# Patient Record
Sex: Male | Born: 1995 | Race: White | Hispanic: No | Marital: Married | Smoking: Never smoker
Health system: Southern US, Community
[De-identification: ages and names within clinical notes are randomized; demographics above are authoritative.]

---
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
## 2016-08-15 ENCOUNTER — Emergency Department
Admission: EM | Admit: 2016-08-15 | Discharge: 2016-08-15 | Disposition: A | Discharge status: Home / Self Care | Payer: Self-pay | Attending: Emergency Medicine | Admitting: Emergency Medicine

## 2016-08-15 ENCOUNTER — Encounter: Payer: Self-pay | Admitting: Physician Assistant

## 2016-08-15 DIAGNOSIS — Z79899 Other long term (current) drug therapy: Secondary | ICD-10-CM | POA: Insufficient documentation

## 2016-08-15 DIAGNOSIS — J029 Acute pharyngitis, unspecified: Secondary | ICD-10-CM | POA: Insufficient documentation

## 2016-08-15 LAB — POCT RAPID STREP A: Streptococcus, Group A Screen (Direct): NEGATIVE

## 2016-08-15 MED ORDER — LIDOCAINE VISCOUS 2 % MT SOLN
15.0000 mL | Freq: Once | OROMUCOSAL | Status: AC
  Administered 2016-08-15: 15 mL via OROMUCOSAL
  Filled 2016-08-15: qty 15

## 2016-08-15 MED ORDER — MAGIC MOUTHWASH W/LIDOCAINE
5.0000 mL | Freq: Four times a day (QID) | ORAL | 0 refills | Status: DC | PRN
Start: 2016-08-15 — End: 2019-03-19

## 2016-08-15 MED ORDER — ACETAMINOPHEN-CODEINE #3 300-30 MG PO TABS: 1.0000 | ORAL_TABLET | Freq: Four times a day (QID) | ORAL | 0 refills | Status: DC | PRN

## 2016-08-15 MED ORDER — PREDNISONE 10 MG PO TABS: 10.0000 mg | ORAL_TABLET | Freq: Two times a day (BID) | ORAL | 0 refills | Status: DC

## 2016-08-15 MED ORDER — DEXAMETHASONE SODIUM PHOSPHATE 10 MG/ML IJ SOLN
10.0000 mg | Freq: Once | INTRAMUSCULAR | Status: AC
  Administered 2016-08-15: 10 mg via INTRAMUSCULAR
  Filled 2016-08-15: qty 1

## 2016-08-15 NOTE — ED Triage Notes (Addendum)
Pt to ED reporting sore throat x 4 days. PT reports having cold sweats at home but unsure if he had a fever. Pt is afebrile today. Pain increases when swallowing. No SOB reported. Tonsils are swollen but airway intact.

## 2016-08-15 NOTE — Discharge Instructions (Signed)
Your rapid strep test was negative. Your symptoms appear to be due to a viral infection. You should take the prescription meds as directed. You will be notified if your throat culture is positive and requires an antibiotic. Follow-up with [facility] as needed.

## 2016-08-16 LAB — CULTURE, GROUP A STREP (THRC)

## 2016-08-16 NOTE — ED Provider Notes (Signed)
[facility] Emergency Department Provider Note ____________________________________________  Time seen: 1324  I have reviewed the triage vital signs and the nursing notes.  HISTORY  Chief Complaint  Sore Throat  HPI Mark Wright is a 21 y.o. male resistance to the ED for evaluation of a 4 day complaint of sore throat. Patient reports having cold sweats at home but is unclear of any frank, however fevers. He is in no acute distress upon arrival and is afebrile at this time. He reports pain increases when he tries to swallow. He denies any shortness of breath, nausea, vomiting, rash, or sick contacts. Patient reports that his tonsils feel swollen and enlarged. He denies history of recurrent strep infection.  History reviewed. No pertinent past medical history.  There are no active problems to display for this patient.  History reviewed. No pertinent surgical history.  Prior to Admission medications   Medication Sig Start Date End Date Taking? Authorizing Provider  acetaminophen-codeine (TYLENOL #3) 300-30 MG tablet Take 1 tablet by mouth every 6 (six) hours as needed for moderate pain. 08/15/16   Jenkins Risdon V Bacon Kendale Rembold, PA-C  dexamethasone (DECADRON) 2 MG tablet Take 6 tablets on Day 1 with food, then decrease by 1 tablet daily until finished (6,5,4,3,2,1) 11/23/15   Jami L Hagler, PA-C  hydrOXYzine (VISTARIL) 25 MG capsule Take 1 capsule (25 mg total) by mouth 3 (three) times daily as needed. 11/23/15   Jami L Hagler, PA-C  magic mouthwash w/lidocaine SOLN Take 5 mLs by mouth 4 (four) times daily as needed for mouth pain. 08/15/16   Elsa Ploch V Bacon Manuela Halbur, PA-C  predniSONE (DELTASONE) 10 MG tablet Take 1 tablet (10 mg total) by mouth 2 (two) times daily with a meal. 08/15/16   Meeghan Skipper V Bacon Brittanyann Wittner, PA-C   Allergies Patient has no known allergies.  No family history on file.  Social History Social History  Substance Use Topics  . Smoking status: Never Smoker   . Smokeless tobacco: Never Used  . Alcohol use Not on file   Review of Systems  Constitutional: Negative for fever. Eyes: Negative for visual changes. ENT: Positive for sore throat. Cardiovascular: Negative for chest pain. Respiratory: Negative for shortness of breath. Gastrointestinal: Negative for abdominal pain, vomiting and diarrhea. Genitourinary: Negative for dysuria. Skin: Negative for rash. Neurological: Negative for headaches, focal weakness or numbness. ____________________________________________  PHYSICAL EXAM:  VITAL SIGNS: ED Triage Vitals [08/15/16 1227]  Enc Vitals Group     BP 131/84     Pulse Rate 80     Resp 16     Temp 97.7 F (36.5 C)     Temp Source Oral     SpO2 99 %     Weight 168 lb (76.2 kg)     Height  (1.727 m)     Head Circumference      Peak Flow      Pain Score 5     Pain Loc      Pain Edu?      Excl. in GC?     Constitutional: Alert and oriented. Well appearing and in no distress. Head: Normocephalic and atraumatic. Eyes: Conjunctivae are normal. PERRL. Normal extraocular movements Ears: Canals clear. TMs intact bilaterally. Nose: No congestion/rhinorrhea/epistaxis. Mouth/Throat: Mucous membranes are moist. Uvula is midline. Tonsils are erythematous, enlarged, with mild, patchy exudates noted, bilaterally. Neck: Supple. No thyromegaly. Hematological/Lymphatic/Immunological: No cervical lymphadenopathy. Cardiovascular: Normal rate, regular rhythm. Normal distal pulses. Respiratory: Normal respiratory effort. No wheezes/rales/rhonchi. Gastrointestinal: Soft  and nontender. No distention. Neurologic:  Normal gait without ataxia. Normal speech and language. No gross focal neurologic deficits are appreciated. Skin:  Skin is warm, dry and intact. No rash noted. ____________________________________________   LABS (pertinent positives/negatives) Labs Reviewed  CULTURE, GROUP A STREP [facility])  POCT RAPID STREP A   ____________________________________________  PROCEDURES  Decadron 10 mg IM Viscous Lido 2% gargle ____________________________________________  INITIAL IMPRESSION / ASSESSMENT AND PLAN / ED COURSE  Patient with a clinical presentation which appears consistent with a viral pharyngitis. He has been afebrile since his course began and is afebrile on presentation. His tonsils are erythematous, and enlarged, and do show some mild patchy exudates,  However, his rapid strep test was negative and a throat cultures are pending at the time of discharge. He'll be discharged with prescriptions for Tylenol #3 , Magic mouthwash, as well as prednisone to take for the next 5 days. He will follow up to his culture results or return to ED as needed. ____________________________________________  FINAL CLINICAL IMPRESSION(S) / ED DIAGNOSES  Final diagnoses:  Viral pharyngitis      Lissa Hoard, PA-C 08/16/16 1852    Myrna Blazer, MD 08/17/16 [phone]

## 2017-02-12 ENCOUNTER — Emergency Department
Admission: EM | Admit: 2017-02-12 | Discharge: 2017-02-12 | Disposition: A | Discharge status: Home / Self Care | Payer: Self-pay | Attending: Emergency Medicine | Admitting: Emergency Medicine

## 2017-02-12 ENCOUNTER — Encounter: Payer: Self-pay | Admitting: Emergency Medicine

## 2017-02-12 DIAGNOSIS — X58XXXA Exposure to other specified factors, initial encounter: Secondary | ICD-10-CM | POA: Insufficient documentation

## 2017-02-12 DIAGNOSIS — Y990 Civilian activity done for income or pay: Secondary | ICD-10-CM | POA: Insufficient documentation

## 2017-02-12 DIAGNOSIS — Y929 Unspecified place or not applicable: Secondary | ICD-10-CM | POA: Insufficient documentation

## 2017-02-12 DIAGNOSIS — S0500XA Injury of conjunctiva and corneal abrasion without foreign body, unspecified eye, initial encounter: Secondary | ICD-10-CM

## 2017-02-12 DIAGNOSIS — S0502XA Injury of conjunctiva and corneal abrasion without foreign body, left eye, initial encounter: Secondary | ICD-10-CM | POA: Insufficient documentation

## 2017-02-12 DIAGNOSIS — Z79899 Other long term (current) drug therapy: Secondary | ICD-10-CM | POA: Insufficient documentation

## 2017-02-12 DIAGNOSIS — Y939 Activity, unspecified: Secondary | ICD-10-CM | POA: Insufficient documentation

## 2017-02-12 MED ORDER — ERYTHROMYCIN 5 MG/GM OP OINT
TOPICAL_OINTMENT | Freq: Three times a day (TID) | OPHTHALMIC | 0 refills | Status: AC
Start: 2017-02-12 — End: 2017-02-22

## 2017-02-12 MED ORDER — FLUORESCEIN SODIUM 1 MG OP STRP
1.0000 | ORAL_STRIP | Freq: Once | OPHTHALMIC | Status: AC
  Administered 2017-02-12: 1 via OPHTHALMIC
  Filled 2017-02-12: qty 1

## 2017-02-12 MED ORDER — KETOROLAC TROMETHAMINE 0.5 % OP SOLN: 1.0000 [drp] | Freq: Four times a day (QID) | OPHTHALMIC | 0 refills | Status: DC

## 2017-02-12 MED ORDER — TETRACAINE HCL 0.5 % OP SOLN
2.0000 [drp] | Freq: Once | OPHTHALMIC | Status: AC
  Administered 2017-02-12: 2 [drp] via OPHTHALMIC
  Filled 2017-02-12: qty 4

## 2017-02-12 MED ORDER — EYE WASH OPHTH SOLN
1.0000 [drp] | OPHTHALMIC | Status: DC | PRN
Start: 2017-02-12 — End: 2017-02-12
  Administered 2017-02-12: 1 [drp] via OPHTHALMIC
  Filled 2017-02-12: qty 118

## 2017-02-12 NOTE — ED Provider Notes (Signed)
[facility] Emergency Department Provider Note   ____________________________________________   I have reviewed the triage vital signs and the nursing notes.   HISTORY  Chief Complaint Eye Problem    HPI Mark Wright is a 21 y.o. male presents to the emergency department with left eye pain, redness, drainage, light sensitivity for 3 days. Patient works in [employer] and he noted after his workday onset of the above symptoms. 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 He denies feeling he has a foreign body in his eye at this time although he feels current symptoms are as result of the foreign body at some point. Patient denies loss of visual acuity, double vision or blurry vision. Patient denies any past history of foreign body in the eye or traumatic injury to the eye. Patient denies fever, chills, headache, chest pain, chest tightness, shortness of breath, abdominal pain, nausea and vomiting.  History reviewed. No pertinent past medical history.  There are no active problems to display for this patient.   History reviewed. No pertinent surgical history.  Prior to Admission medications   Medication Sig Start Date End Date Taking? Authorizing Provider  acetaminophen-codeine (TYLENOL #3) 300-30 MG tablet Take 1 tablet by mouth every 6 (six) hours as needed for moderate pain. 08/15/16   Menshew, Charlesetta Ivory, PA-C  dexamethasone (DECADRON) 2 MG tablet Take 6 tablets on Day 1 with food, then decrease by 1 tablet daily until finished (6,5,4,3,2,1) 11/23/15   Hagler, Jami L, PA-C  erythromycin [facility]) ophthalmic ointment Place into the right eye 3 (three) times daily. Place a 1/2 inch ribbon of ointment into the lower eyelid. 02/12/17 02/22/17  Rylyn Ranganathan M, PA-C  hydrOXYzine (VISTARIL) 25 MG capsule Take 1 capsule (25 mg total) by mouth 3 (three) times daily as needed. 11/23/15   Hagler, Jami L, PA-C  ketorolac (ACULAR)  0.5 % ophthalmic solution Place 1 drop into the left eye 4 (four) times daily. 02/12/17   Charee Tumblin M, PA-C  magic mouthwash w/lidocaine SOLN Take 5 mLs by mouth 4 (four) times daily as needed for mouth pain. 08/15/16   Menshew, Charlesetta Ivory, PA-C  predniSONE (DELTASONE) 10 MG tablet Take 1 tablet (10 mg total) by mouth 2 (two) times daily with a meal. 08/15/16   Menshew, Charlesetta Ivory, PA-C    Allergies Patient has no known allergies.  No family history on file.  Social History Social History  Substance Use Topics  . Smoking status: Never Smoker  . Smokeless tobacco: Never Used  . Alcohol use Yes    Review of Systems Constitutional: Negative for fever/chills Eyes: Left eye pain, redness and light sensitivity. Cardiovascular: Denies chest pain. Respiratory: Denies shortness of breath. Skin: Negative for rash. Neurological: Negative for headaches.  Negative focal weakness or numbness. Negative for loss of consciousness.  ____________________________________________   PHYSICAL EXAM:  VITAL SIGNS: ED Triage Vitals  Enc Vitals Group     BP 02/12/17 1342 135/85     Pulse Rate 02/12/17 1342 (!) 56     Resp 02/12/17 1342 14     Temp 02/12/17 1342 (!) 97.5 F (36.4 C)     Temp Source 02/12/17 1342 Oral     SpO2 02/12/17 1342 99 %     Weight 02/12/17 1343 164 lb (74.4 kg)     Height 02/12/17 1343  (1.778 m)     Head Circumference --  Peak Flow --      Pain Score --      Pain Loc --      Pain Edu? --      Excl. in GC? --     Constitutional: Alert and oriented. Well appearing and in no acute distress.  Eyes: Subconjunctival hemorrhage of the left eye, right eye normal. PERRL. EOMI, intact visual acuity bilaterally. Head: Normocephalic and atraumatic. Respiratory: Normal respiratory effort without tachypnea or retractions.  Cardiovascular: Normal rate, regular rhythm. Normal distal pulses. Neurologic: Normal speech and language.  Skin:  Skin is warm, dry and  intact. No rash noted. Psychiatric: Mood and affect are normal. Speech and behavior are normal. Patient exhibits appropriate insight and judgement.  ____________________________________________   LABS (all labs ordered are listed, but only abnormal results are displayed)  Labs Reviewed - No data to display ____________________________________________  EKG None ____________________________________________  RADIOLOGY None ____________________________________________   PROCEDURES  Procedure(s) performed: Fluorescein stain eye exam performed following physical exam:  Performed by: Clois Comber Authorized by: Clois Comber Consent: Verbal consent obtained. Risks and benefits: risks, benefits and alternatives were discussed Consent given by: patient Irrigated left eye with Eye Stream eye wash.  (1) drop Tetracaine instilled followed by instillation of fluorescein dye with fluorescein strip.  Examination with Joseph Art slit lamp performed: Small corneal abrasion noted at 1:00 position of left eye. No other defects noted.  Following the exam:  Left eye irrigated with Eye Stream and (1) drop of Tetracaine instilled.   Patient tolerance: Patient tolerated the procedure well with no immediate complications    Critical Care performed: no ____________________________________________   INITIAL IMPRESSION / ASSESSMENT AND PLAN / ED COURSE  Pertinent labs & imaging results that were available during my care of the patient were reviewed by me and considered in my medical decision making (see chart for details).   Patient presents to emergency department with left eye pain, some conjunctival hemorrhage, clear watery drainage, light sensitivity for 3 days.  History, physical exam findings, fluorescein stain eye exam findings are consistent with small corneal abrasion. Patient will be prescribed erythromycin ophthalmic ointment and Acular eyedrops. Patient advised to follow up with  ophthalmology as needed or return to the emergency department if symptoms return or worsen. Patient informed of clinical course, understand medical decision-making process, and agree with plan. _______________________________   FINAL CLINICAL IMPRESSION(S) / ED DIAGNOSES  Final diagnoses:  Corneal abrasion, unspecified laterality, initial encounter       NEW MEDICATIONS STARTED DURING THIS VISIT:  Discharge Medication List as of 02/12/2017  3:20 PM    START taking these medications   Details  erythromycin (ROMYCIN) ophthalmic ointment Place into the right eye 3 (three) times daily. Place a 1/2 inch ribbon of ointment into the lower eyelid., Starting Thu 02/12/2017, Until Sun 02/22/2017, Print    ketorolac (ACULAR) 0.5 % ophthalmic solution Place 1 drop into the left eye 4 (four) times daily., Starting Thu 02/12/2017, Print         Note:  This document was prepared using Dragon voice recognition software and may include unintentional dictation errors.    Clois Comber, PA-C 02/13/17 1656    Jeanmarie Plant, MD 02/14/17 [phone]

## 2017-02-12 NOTE — ED Notes (Signed)
Left eye red, swollen, sensitive to light x 3 days.

## 2017-02-12 NOTE — ED Triage Notes (Signed)
Says he has exudate from left eye for 3 days.  Says some pain, no itching.  No injury.

## 2017-02-12 NOTE — ED Triage Notes (Signed)
Vis acuity  Left 20/25 Right 20/20

## 2017-02-12 NOTE — Discharge Instructions (Signed)
Take medication as prescribed. Return to emergency department if symptoms worsen and follow-up with PCP as needed.   °

## 2019-02-28 ENCOUNTER — Other Ambulatory Visit: Payer: Self-pay

## 2019-02-28 DIAGNOSIS — Z20822 Contact with and (suspected) exposure to covid-19: Secondary | ICD-10-CM

## 2019-03-01 LAB — NOVEL CORONAVIRUS, NAA: SARS-CoV-2, NAA: NOT DETECTED

## 2019-03-02 ENCOUNTER — Telehealth: Payer: Self-pay | Admitting: *Deleted

## 2019-03-02 NOTE — Telephone Encounter (Signed)
Calling for covid19 results. Informed none available as of yet.

## 2019-03-19 ENCOUNTER — Other Ambulatory Visit: Payer: Self-pay

## 2019-03-19 ENCOUNTER — Encounter: Payer: Self-pay | Admitting: Emergency Medicine

## 2019-03-19 ENCOUNTER — Emergency Department
Admission: EM | Admit: 2019-03-19 | Discharge: 2019-03-19 | Disposition: A | Discharge status: Home / Self Care | Payer: Self-pay | Attending: Emergency Medicine | Admitting: Emergency Medicine

## 2019-03-19 DIAGNOSIS — L237 Allergic contact dermatitis due to plants, except food: Secondary | ICD-10-CM | POA: Insufficient documentation

## 2019-03-19 MED ORDER — PREDNISONE 10 MG PO TABS: ORAL_TABLET | ORAL | 0 refills | Status: AC

## 2019-03-19 MED ORDER — FAMOTIDINE 20 MG PO TABS
20.0000 mg | ORAL_TABLET | Freq: Once | ORAL | Status: AC
  Administered 2019-03-19: 10:00:00 20 mg via ORAL
  Filled 2019-03-19: qty 1

## 2019-03-19 MED ORDER — METHYLPREDNISOLONE SODIUM SUCC 125 MG IJ SOLR
125.0000 mg | Freq: Once | INTRAMUSCULAR | Status: AC
  Administered 2019-03-19: 125 mg via INTRAMUSCULAR
  Filled 2019-03-19: qty 2
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
## 2019-03-19 NOTE — ED Notes (Signed)
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

## 2019-03-19 NOTE — ED Triage Notes (Signed)
Rash arms x 5 days.

## 2020-06-28 ENCOUNTER — Other Ambulatory Visit: Payer: Self-pay

## 2020-06-28 ENCOUNTER — Encounter: Payer: Self-pay | Admitting: Emergency Medicine

## 2020-06-28 DIAGNOSIS — K029 Dental caries, unspecified: Secondary | ICD-10-CM | POA: Insufficient documentation

## 2020-06-28 NOTE — ED Triage Notes (Signed)
Patient ambulatory to triage with steady gait, without difficulty or distress noted; st last wk noted rt earache; began taking some amoxi and felt better then noted it was dental pain; tonight having worse "shooting" pain to jaw and ear

## 2020-06-29 ENCOUNTER — Emergency Department
Admission: EM | Admit: 2020-06-29 | Discharge: 2020-06-29 | Disposition: A | Discharge status: Home / Self Care | Payer: Self-pay | Attending: Emergency Medicine | Admitting: Emergency Medicine

## 2020-06-29 DIAGNOSIS — K0889 Other specified disorders of teeth and supporting structures: Secondary | ICD-10-CM

## 2020-06-29 DIAGNOSIS — K029 Dental caries, unspecified: Secondary | ICD-10-CM

## 2020-06-29 NOTE — ED Provider Notes (Signed)
[facility] Emergency Department Provider Note   ____________________________________________   Event Date/Time   First MD Initiated Contact with Patient 06/29/20 0113     (approximate)  I have reviewed the triage vital signs and the nursing notes.   HISTORY  Chief Complaint Jaw Pain    HPI Mark Wright is a 25 y.o. male with a stated past medical history of dental caries who presents for right-sided facial and jaw pain that radiates to his ear and has been worsening over the last week.  Patient describes an aching, 10/10, radiating right tooth and facial pain that is worse with chewing.  Patient states that he grinds his teeth at night and is often woken up with severe pain to the right side of his face.  Patient denies any purulent drainage from this tooth, fevers, nausea/vomiting, blurry vision, or tinnitus         History reviewed. No pertinent past medical history.  There are no problems to display for this patient.   History reviewed. No pertinent surgical history.  Prior to Admission medications   Medication Sig Start Date End Date Taking? Authorizing Provider  predniSONE (DELTASONE) 10 MG tablet Take 6 pills days 1-3. Take 5 pills day 4-6. Take 4 pills day 7-9. Take 3 pills day 10-12. Take 2 pills day 13-15. Take 1 pill day 16-18. 03/19/19   Enid Derry, PA-C    Allergies Patient has no known allergies.  No family history on file.  Social History Social History   Tobacco Use  . Smoking status: Never Smoker  . Smokeless tobacco: Never Used  Vaping Use  . Vaping Use: Never used  Substance Use Topics  . Alcohol use: Yes    Review of Systems Constitutional: No fever/chills Eyes: No visual changes. ENT: No sore throat.  Endorses dental pain on the right side Cardiovascular: Denies chest pain. Respiratory: Denies shortness of breath. Gastrointestinal: No abdominal pain.  No nausea, no vomiting.  No diarrhea. Genitourinary:  Negative for dysuria. Musculoskeletal: Negative for acute arthralgias Skin: Negative for rash. Neurological: Negative for headaches, weakness/numbness/paresthesias in any extremity Psychiatric: Negative for suicidal ideation/homicidal ideation   ____________________________________________   PHYSICAL EXAM:  VITAL SIGNS: ED Triage Vitals  Enc Vitals Group     BP 06/28/20 2321 140/79     Pulse Rate 06/28/20 2321 (!) 55     Resp 06/28/20 2321 18     Temp 06/28/20 2321 97.8 F (36.6 C)     Temp Source 06/28/20 2321 Oral     SpO2 06/28/20 2321 100 %     Weight 06/28/20 2322 228 lb (103.4 kg)     Height 06/28/20 2322 5\' 10"  (1.778 m)     Head Circumference --      Peak Flow --      Pain Score 06/28/20 2322 0     Pain Loc --      Pain Edu? --      Excl. in GC? --    Constitutional: Alert and oriented. Well appearing and in no acute distress. Eyes: Conjunctivae are normal. PERRL. Head: Atraumatic. Nose: No congestion/rhinnorhea. Mouth/Throat: Mucous membranes are moist.  Dental caries and specific lead deep cavity to tooth 32 Neck: No stridor Cardiovascular: Grossly normal heart sounds.  Good peripheral circulation. Respiratory: Normal respiratory effort.  No retractions. Gastrointestinal: Soft and nontender. No distention. Musculoskeletal: No obvious deformities Neurologic:  Normal speech and language. No gross focal neurologic deficits are appreciated. Skin:  Skin is warm and  dry. No rash noted. Psychiatric: Mood and affect are normal. Speech and behavior are normal.  PROCEDURES  Procedure(s) performed (including Critical Care):  Procedures   ____________________________________________   INITIAL IMPRESSION / ASSESSMENT AND PLAN / ED COURSE  As part of my medical decision making, I reviewed the following data within the electronic MEDICAL RECORD NUMBER Nursing notes reviewed and incorporated, Old chart reviewed, and Notes from prior ED visits reviewed and  incorporated        Patient not immunosuppressed. No e/o tooth fracture, avulsion, or bleeding socket. No e/o RPA, PTA, Ludwigs angina, periapical abscess. No e/o gingival hyperplasia or concern for drug reaction.  Rx Ibuprofen. Defer ABX for dental pain alone with no evidence of infection. Disposition: Discharge home. Discussed return precautions for odontogenic infections and other dental pain emergencies. Will provide dental clinic list.      ____________________________________________   FINAL CLINICAL IMPRESSION(S) / ED DIAGNOSES  Final diagnoses:  Pain, dental  Simple dental cavity     ED Discharge Orders    None       Note:  This document was prepared using Dragon voice recognition software and may include unintentional dictation errors.   Merwyn Katos, MD 06/29/20 [phone]

## 2020-09-01 ENCOUNTER — Emergency Department
Admission: EM | Admit: 2020-09-01 | Discharge: 2020-09-01 | Disposition: A | Discharge status: Home / Self Care | Payer: Self-pay | Attending: Emergency Medicine | Admitting: Emergency Medicine

## 2020-09-01 ENCOUNTER — Encounter: Payer: Self-pay | Admitting: Emergency Medicine

## 2020-09-01 ENCOUNTER — Emergency Department: Payer: Self-pay

## 2020-09-01 DIAGNOSIS — W3409XA Accidental discharge from other specified firearms, initial encounter: Secondary | ICD-10-CM | POA: Insufficient documentation

## 2020-09-01 DIAGNOSIS — W3400XA Accidental discharge from unspecified firearms or gun, initial encounter: Secondary | ICD-10-CM

## 2020-09-01 DIAGNOSIS — Z23 Encounter for immunization: Secondary | ICD-10-CM | POA: Insufficient documentation

## 2020-09-01 DIAGNOSIS — R42 Dizziness and giddiness: Secondary | ICD-10-CM | POA: Insufficient documentation

## 2020-09-01 DIAGNOSIS — S61402A Unspecified open wound of left hand, initial encounter: Secondary | ICD-10-CM | POA: Insufficient documentation

## 2020-09-01 MED ORDER — ONDANSETRON HCL 4 MG/2ML IJ SOLN
4.0000 mg | Freq: Once | INTRAMUSCULAR | Status: AC
  Administered 2020-09-01: 4 mg via INTRAVENOUS
  Filled 2020-09-01: qty 2

## 2020-09-01 MED ORDER — SODIUM CHLORIDE 0.9 % IV BOLUS
1000.0000 mL | Freq: Once | INTRAVENOUS | Status: AC
  Administered 2020-09-01: 1000 mL via INTRAVENOUS

## 2020-09-01 MED ORDER — TETANUS-DIPHTH-ACELL PERTUSSIS 5-2.5-18.5 LF-MCG/0.5 IM SUSY
0.5000 mL | PREFILLED_SYRINGE | Freq: Once | INTRAMUSCULAR | Status: AC
  Administered 2020-09-01: 0.5 mL via INTRAMUSCULAR
  Filled 2020-09-01: qty 0.5
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
## 2020-09-01 NOTE — Discharge Instructions (Signed)
Please call the number provided for orthopedics to arrange a follow-up appointment.  Please keep the dressing intact for the next 48 hours after which you may remove, keep the wound covered with Neosporin and a bandage at all times until it has completely healed.  Return to the emergency department for any increased pain, fever, or any other symptom personally concerning to yourself.

## 2020-09-01 NOTE — ED Notes (Signed)
[place] [employer] with pt
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
